# Patient Record
Sex: Male | Born: 1965 | Race: Black or African American | Hispanic: No | Marital: Single
Health system: Southern US, Community
[De-identification: ages and names within clinical notes are randomized; demographics above are authoritative.]

## PROBLEM LIST (undated history)

## (undated) ENCOUNTER — Emergency Department (HOSPITAL_COMMUNITY): Payer: Self-pay | Source: Home / Self Care

## (undated) ENCOUNTER — Emergency Department (HOSPITAL_COMMUNITY): Admission: EM | Payer: Self-pay | Source: Home / Self Care

---

## 2014-02-28 ENCOUNTER — Ambulatory Visit
Admission: RE | Admit: 2014-02-28 | Discharge: 2014-02-28 | Disposition: A | Discharge status: Home / Self Care | Payer: No Typology Code available for payment source | Source: Ambulatory Visit | Attending: *Deleted | Admitting: *Deleted

## 2014-02-28 ENCOUNTER — Other Ambulatory Visit: Payer: Self-pay | Admitting: *Deleted

## 2014-02-28 DIAGNOSIS — R7611 Nonspecific reaction to tuberculin skin test without active tuberculosis: Secondary | ICD-10-CM

## 2015-04-30 IMAGING — CR DG CHEST 2V
2 series · 2 of 2 positions shown · non-contrast
Comparison: None.

CLINICAL DATA: Positive PPD, smoking history

EXAM:
CHEST  2 VIEW

[view not recorded (1 of 2)]
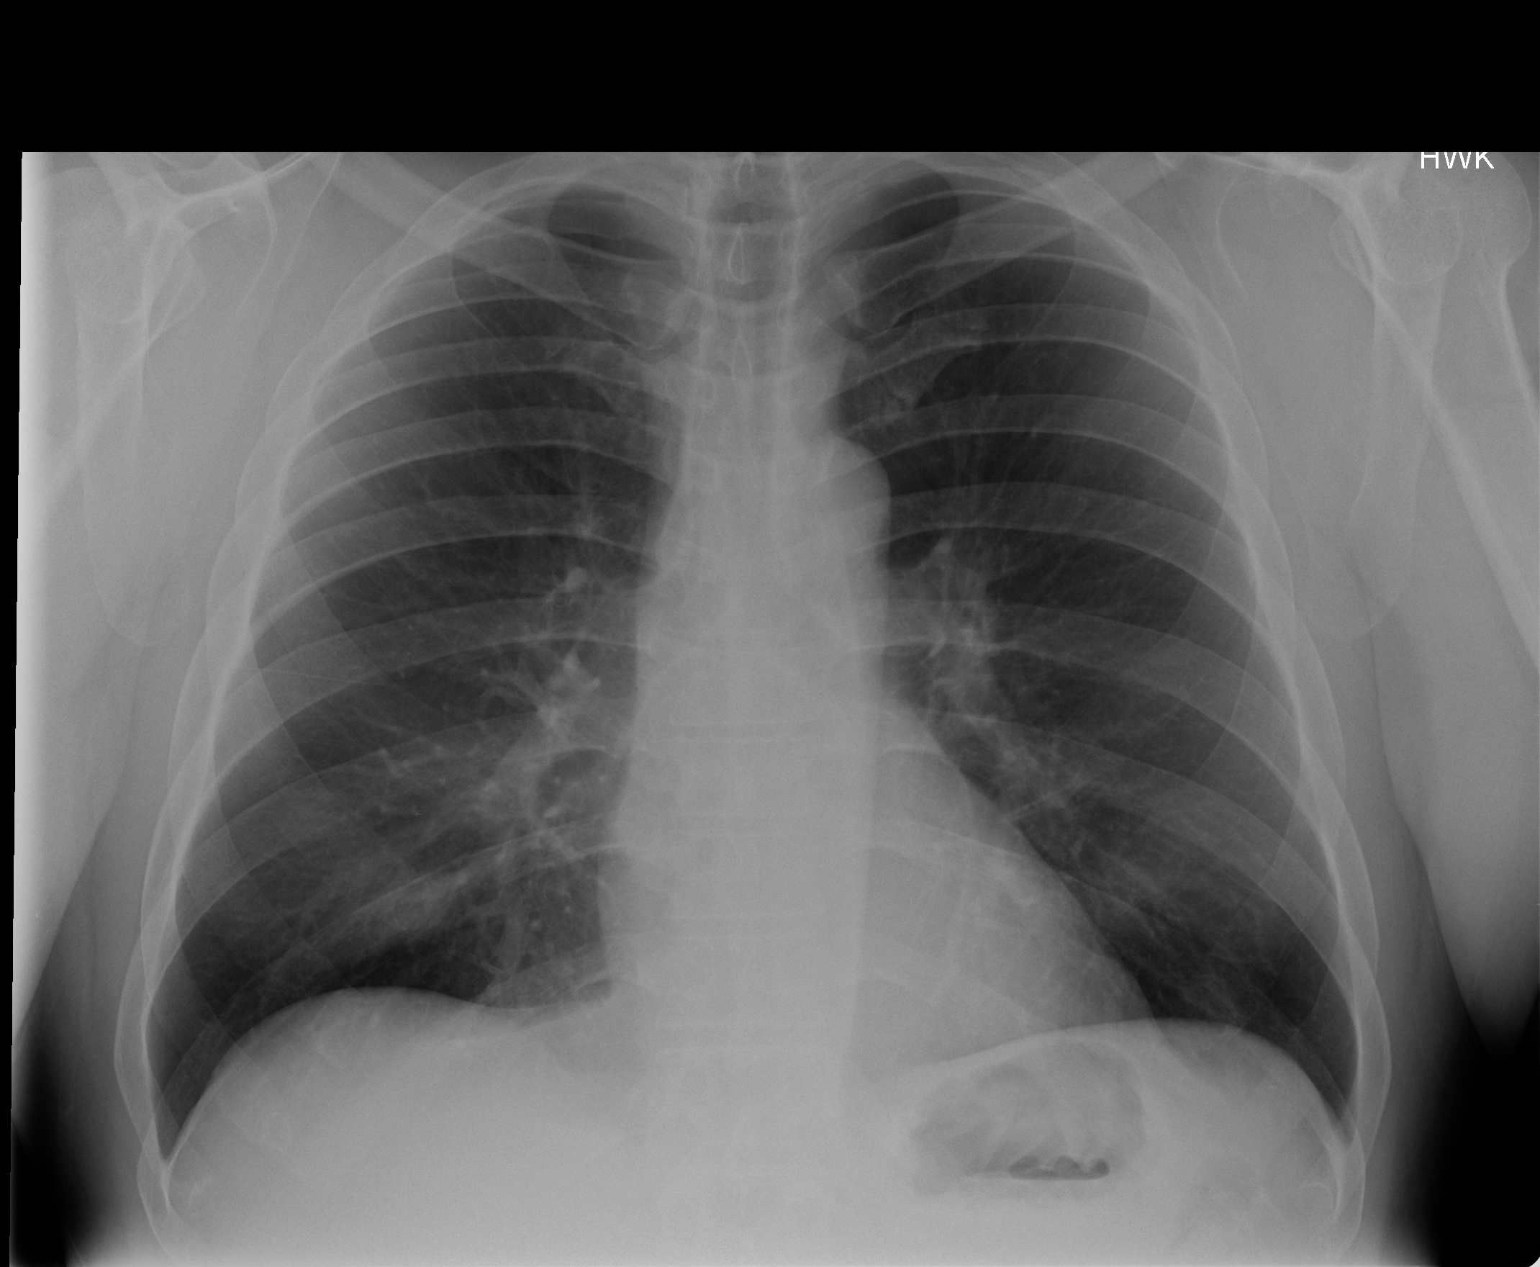

[view not recorded (2 of 2)]
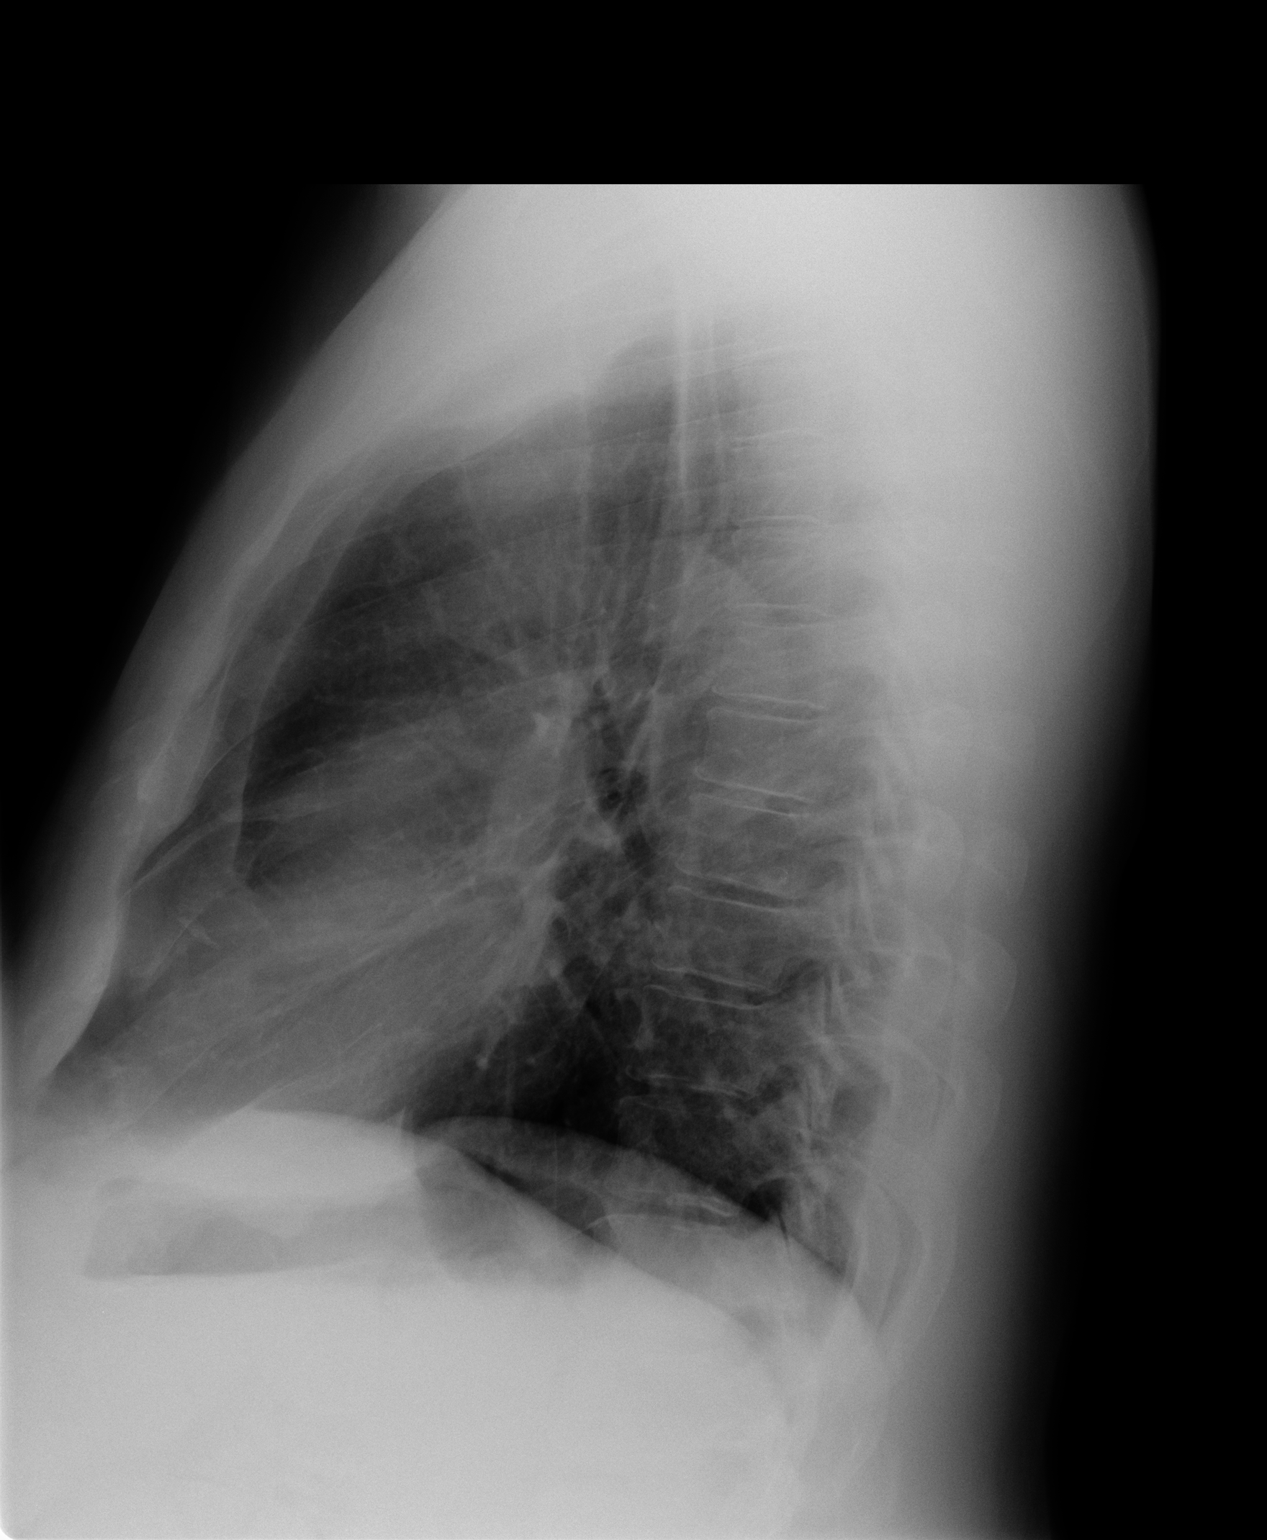

[2 of 2 positions shown; findings below may reference images not displayed]

FINDINGS: No active infiltrate or effusion is seen. No sequela of prior
tuberculous infection is noted. Mediastinal contours appear normal
and the heart is within normal limits in size. No bony abnormality
is seen.
IMPRESSION: No active cardiopulmonary disease.
